# Patient Record
Sex: Female | Born: 1940 | Race: White | Hispanic: No
Health system: Southern US, Community
[De-identification: ages and names within clinical notes are randomized; demographics above are authoritative.]

## PROBLEM LIST (undated history)

## (undated) DIAGNOSIS — E039 Hypothyroidism, unspecified: Secondary | ICD-10-CM

## (undated) DIAGNOSIS — F0390 Unspecified dementia without behavioral disturbance: Secondary | ICD-10-CM

## (undated) DIAGNOSIS — I2510 Atherosclerotic heart disease of native coronary artery without angina pectoris: Secondary | ICD-10-CM

## (undated) DIAGNOSIS — E119 Type 2 diabetes mellitus without complications: Secondary | ICD-10-CM

## (undated) DIAGNOSIS — I739 Peripheral vascular disease, unspecified: Secondary | ICD-10-CM

## (undated) DIAGNOSIS — D649 Anemia, unspecified: Secondary | ICD-10-CM

---

## 2016-09-29 DIAGNOSIS — F39 Unspecified mood [affective] disorder: Secondary | ICD-10-CM | POA: Diagnosis not present

## 2016-09-29 DIAGNOSIS — I2510 Atherosclerotic heart disease of native coronary artery without angina pectoris: Secondary | ICD-10-CM

## 2016-09-29 DIAGNOSIS — I739 Peripheral vascular disease, unspecified: Secondary | ICD-10-CM

## 2016-09-29 DIAGNOSIS — K219 Gastro-esophageal reflux disease without esophagitis: Secondary | ICD-10-CM | POA: Diagnosis not present

## 2016-09-29 DIAGNOSIS — F0150 Vascular dementia without behavioral disturbance: Secondary | ICD-10-CM | POA: Diagnosis not present

## 2016-09-29 DIAGNOSIS — E441 Mild protein-calorie malnutrition: Secondary | ICD-10-CM | POA: Diagnosis not present

## 2016-09-29 DIAGNOSIS — E1159 Type 2 diabetes mellitus with other circulatory complications: Secondary | ICD-10-CM | POA: Diagnosis not present

## 2016-10-15 ENCOUNTER — Emergency Department: Payer: Medicare Other

## 2016-10-15 ENCOUNTER — Encounter: Payer: Self-pay | Admitting: *Deleted

## 2016-10-15 ENCOUNTER — Inpatient Hospital Stay
Admission: EM | Admit: 2016-10-15 | Discharge: 2016-11-14 | DRG: 064 | Disposition: E | Payer: Medicare Other | Attending: Internal Medicine | Admitting: Internal Medicine

## 2016-10-15 DIAGNOSIS — I615 Nontraumatic intracerebral hemorrhage, intraventricular: Principal | ICD-10-CM | POA: Diagnosis present

## 2016-10-15 DIAGNOSIS — R4020 Unspecified coma: Secondary | ICD-10-CM | POA: Diagnosis present

## 2016-10-15 DIAGNOSIS — R402213 Coma scale, best verbal response, none, at hospital admission: Secondary | ICD-10-CM | POA: Diagnosis present

## 2016-10-15 DIAGNOSIS — Z66 Do not resuscitate: Secondary | ICD-10-CM | POA: Diagnosis present

## 2016-10-15 DIAGNOSIS — R402143 Coma scale, eyes open, spontaneous, at hospital admission: Secondary | ICD-10-CM | POA: Diagnosis present

## 2016-10-15 DIAGNOSIS — I161 Hypertensive emergency: Secondary | ICD-10-CM | POA: Diagnosis present

## 2016-10-15 DIAGNOSIS — Z7984 Long term (current) use of oral hypoglycemic drugs: Secondary | ICD-10-CM | POA: Diagnosis not present

## 2016-10-15 DIAGNOSIS — Z515 Encounter for palliative care: Secondary | ICD-10-CM | POA: Diagnosis present

## 2016-10-15 DIAGNOSIS — I619 Nontraumatic intracerebral hemorrhage, unspecified: Secondary | ICD-10-CM

## 2016-10-15 DIAGNOSIS — G936 Cerebral edema: Secondary | ICD-10-CM | POA: Diagnosis present

## 2016-10-15 DIAGNOSIS — I2510 Atherosclerotic heart disease of native coronary artery without angina pectoris: Secondary | ICD-10-CM | POA: Diagnosis present

## 2016-10-15 DIAGNOSIS — R402343 Coma scale, best motor response, flexion withdrawal, at hospital admission: Secondary | ICD-10-CM | POA: Diagnosis present

## 2016-10-15 DIAGNOSIS — Z885 Allergy status to narcotic agent status: Secondary | ICD-10-CM

## 2016-10-15 DIAGNOSIS — Z79899 Other long term (current) drug therapy: Secondary | ICD-10-CM | POA: Diagnosis not present

## 2016-10-15 DIAGNOSIS — G935 Compression of brain: Secondary | ICD-10-CM

## 2016-10-15 DIAGNOSIS — E1151 Type 2 diabetes mellitus with diabetic peripheral angiopathy without gangrene: Secondary | ICD-10-CM | POA: Diagnosis present

## 2016-10-15 DIAGNOSIS — G9340 Encephalopathy, unspecified: Secondary | ICD-10-CM

## 2016-10-15 DIAGNOSIS — D638 Anemia in other chronic diseases classified elsewhere: Secondary | ICD-10-CM | POA: Diagnosis present

## 2016-10-15 DIAGNOSIS — F0150 Vascular dementia without behavioral disturbance: Secondary | ICD-10-CM | POA: Diagnosis present

## 2016-10-15 DIAGNOSIS — Z7902 Long term (current) use of antithrombotics/antiplatelets: Secondary | ICD-10-CM | POA: Diagnosis not present

## 2016-10-15 DIAGNOSIS — I629 Nontraumatic intracranial hemorrhage, unspecified: Secondary | ICD-10-CM

## 2016-10-15 DIAGNOSIS — E039 Hypothyroidism, unspecified: Secondary | ICD-10-CM | POA: Diagnosis present

## 2016-10-15 DIAGNOSIS — R4182 Altered mental status, unspecified: Secondary | ICD-10-CM | POA: Diagnosis present

## 2016-10-15 HISTORY — DX: Peripheral vascular disease, unspecified: I73.9

## 2016-10-15 HISTORY — DX: Type 2 diabetes mellitus without complications: E11.9

## 2016-10-15 HISTORY — DX: Hypothyroidism, unspecified: E03.9

## 2016-10-15 HISTORY — DX: Atherosclerotic heart disease of native coronary artery without angina pectoris: I25.10

## 2016-10-15 HISTORY — DX: Anemia, unspecified: D64.9

## 2016-10-15 HISTORY — DX: Unspecified dementia, unspecified severity, without behavioral disturbance, psychotic disturbance, mood disturbance, and anxiety: F03.90

## 2016-10-15 LAB — COMPREHENSIVE METABOLIC PANEL
ALBUMIN: 4.2 g/dL (ref 3.5–5.0)
ALT: 41 U/L (ref 14–54)
AST: 61 U/L — AB (ref 15–41)
Alkaline Phosphatase: 78 U/L (ref 38–126)
Anion gap: 14 (ref 5–15)
BUN: 26 mg/dL — AB (ref 6–20)
CHLORIDE: 99 mmol/L — AB (ref 101–111)
CO2: 24 mmol/L (ref 22–32)
CREATININE: 0.78 mg/dL (ref 0.44–1.00)
Calcium: 9.6 mg/dL (ref 8.9–10.3)
GFR calc non Af Amer: >60 mL/min (ref >60)
GLUCOSE: 268 mg/dL — AB (ref 65–99)
Potassium: 3.9 mmol/L (ref 3.5–5.1)
SODIUM: 137 mmol/L (ref 135–145)
Total Bilirubin: 0.7 mg/dL (ref 0.3–1.2)
Total Protein: 8.2 g/dL — ABNORMAL HIGH (ref 6.5–8.1)

## 2016-10-15 LAB — LACTIC ACID, PLASMA: LACTIC ACID, VENOUS: 2.5 mmol/L — AB (ref 0.5–1.9)

## 2016-10-15 LAB — CBC
HCT: 34.2 % — ABNORMAL LOW (ref 35.0–47.0)
Hemoglobin: 11.7 g/dL — ABNORMAL LOW (ref 12.0–16.0)
MCH: 30.4 pg (ref 26.0–34.0)
MCHC: 34.2 g/dL (ref 32.0–36.0)
MCV: 88.9 fL (ref 80.0–100.0)
Platelets: 207 10*3/uL (ref 150–440)
RBC: 3.84 MIL/uL (ref 3.80–5.20)
RDW: 13.6 % (ref 11.5–14.5)
WBC: 19.3 10*3/uL — ABNORMAL HIGH (ref 3.6–11.0)

## 2016-10-15 LAB — TROPONIN I

## 2016-10-15 LAB — GLUCOSE, CAPILLARY: Glucose-Capillary: 255 mg/dL — ABNORMAL HIGH (ref 65–99)

## 2016-10-15 MED ORDER — ORAL CARE MOUTH RINSE
15.0000 mL | Freq: Two times a day (BID) | OROMUCOSAL | Status: DC
  Administered 2016-10-15 – 2016-10-17 (×4): 15 mL via OROMUCOSAL

## 2016-10-15 MED ORDER — NICARDIPINE HCL IN NACL 20-0.86 MG/200ML-% IV SOLN
3.0000 mg/h | Freq: Once | INTRAVENOUS | Status: AC
  Administered 2016-10-15: 5 mg/h via INTRAVENOUS
  Filled 2016-10-15: qty 200

## 2016-10-15 MED ORDER — LORAZEPAM 2 MG/ML IJ SOLN
0.5000 mg | INTRAMUSCULAR | Status: DC | PRN
  Administered 2016-10-15: 20:00:00 0.5 mg via INTRAVENOUS
  Filled 2016-10-15: qty 1

## 2016-10-15 MED ORDER — MANNITOL 25 % IV SOLN
25.0000 g | Freq: Once | INTRAVENOUS | Status: AC
  Administered 2016-10-15: 25 g via INTRAVENOUS
  Filled 2016-10-15 (×2): qty 100

## 2016-10-15 MED ORDER — MORPHINE SULFATE (CONCENTRATE) 10 MG/0.5ML PO SOLN
10.0000 mg | ORAL | Status: DC | PRN
  Administered 2016-10-15: 19:00:00 10 mg via ORAL
  Filled 2016-10-15: qty 1

## 2016-10-15 MED ORDER — SODIUM CHLORIDE 0.9 % IV BOLUS (SEPSIS)
1000.0000 mL | Freq: Once | INTRAVENOUS | Status: AC
  Administered 2016-10-15: 1000 mL via INTRAVENOUS

## 2016-10-15 NOTE — Progress Notes (Signed)
LCSW consulted with ED RN and she was calling the chaplain for the family. LCSW met with family and consulted medical staff and found out the family does have to agree to pay the bed at [facility]. At this time the family has been advised that Seth Bake at [facility] was called and that no decisions had to be made right now.   LCSW will provide support to family as needed.  [employer] LCSW [phone]

## 2016-10-15 NOTE — ED Notes (Signed)
Dr. Roxan Hockeyobinson notified of critical lactic acid

## 2016-10-15 NOTE — ED Notes (Signed)
Pt resting in bed, per admitting MD cardene and mannitol turned off, POA granddaughter at bedside to make deicisions

## 2016-10-15 NOTE — ED Notes (Signed)
Admitting MD at bedside.

## 2016-10-15 NOTE — Plan of Care (Signed)
Problem: Education: Goal: Knowledge of [facility] Education information/materials will improve Outcome: Progressing Pt new admission to Room 105 under Comfort Measures this shift

## 2016-10-15 NOTE — H&P (Signed)
[facility] [place] [facility]   PATIENT NAME: Linda Austin    MR#:  [id number]  DATE OF BIRTH:  1940/05/30  DATE OF ADMISSION:  11/06/2016  PRIMARY CARE PHYSICIAN: Karie Schwalbe, MD   REQUESTING/REFERRING PHYSICIAN: Dr. Willy Eddy  CHIEF COMPLAINT: Altered mental status    Chief Complaint  Patient presents with  . Altered Mental Status    HISTORY OF PRESENT ILLNESS:  Linda Austin  is a 76 y.o. female with a known history of coronary artery disease on Plavix comes from [facility] [place] because of worsening level of consciousness since morning associated with multiple episodes of vomiting. Patient noted to have large acute intracranial hemorrhage in left hemisphere with cerebral edema, mass effect. Reportedly patient was normal yesterday. And patient family mainly the daughter and granddaughter who is the power of attorney requested comfort care measures.  PAST MEDICAL HISTORY:   Past Medical History:  Diagnosis Date  . Anemia   . Coronary artery disease   . Dementia   . Diabetes mellitus without complication (HCC)   . Hypothyroid   . PVD (peripheral vascular disease) (HCC)     PAST SURGICAL HISTOIRY:  History reviewed. No pertinent surgical history.  SOCIAL HISTORY:   Social History  Substance Use Topics  . Smoking status: Unknown If Ever Smoked  . Smokeless tobacco: Not on file  . Alcohol use No    FAMILY HISTORY:  No family history on file.  DRUG ALLERGIES:   Allergies  Allergen Reactions  . Codeine     REVIEW OF SYSTEMS:  Unable to obtain review of systems because of her mental status.  MEDICATIONS AT HOME:   Prior to Admission medications   Medication Sig Start Date End Date Taking? Authorizing Provider  acetaminophen (TYLENOL) 325 MG tablet Take 650 mg by mouth 3 (three) times daily as needed for pain.   Yes [provider]  atorvastatin (LIPITOR) 40 MG tablet Take 40 mg by mouth daily. 10/06/16  Yes [provider]  carvedilol (COREG) 6.25 MG tablet Take 6.25 mg by mouth 2 (two) times daily. 09/27/16  Yes [provider]  clopidogrel (PLAVIX) 75 MG tablet Take 75 mg by mouth daily. 09/27/16  Yes [provider]  donepezil (ARICEPT) 10 MG tablet Take 10 mg by mouth at bedtime. 09/27/16  Yes [provider]  ergocalciferol (VITAMIN D2) 50000 units capsule Take 50,000 Units by mouth every 30 (thirty) days.   Yes [provider]  fexofenadine (ALLEGRA) 60 MG tablet Take 60 mg by mouth 2 (two) times daily as needed.   Yes [provider]  levothyroxine (SYNTHROID, LEVOTHROID) 50 MCG tablet Take 50 mcg by mouth daily. 09/27/16  Yes [provider]  LORazepam (ATIVAN) 0.5 MG tablet Take 0.25 mg by mouth every 8 (eight) hours as needed for anxiety. 09/27/16 10/30/16 Yes [provider]  memantine (NAMENDA) 10 MG tablet Take 10 mg by mouth 2 (two) times daily. 10/12/16  Yes [provider]  metFORMIN (GLUCOPHAGE) 500 MG tablet Take 500 mg by mouth 2 (two) times daily. 09/27/16  Yes [provider]  Multiple Vitamin (MULTI-VITAMINS) TABS Take 1 tablet by mouth daily.   Yes [provider]  pantoprazole (PROTONIX) 40 MG tablet Take 40 mg by mouth daily. 09/27/16  Yes [provider]  traZODone (DESYREL) 50 MG tablet Take 50 mg by mouth at bedtime. 10/13/16  Yes [provider]      VITAL SIGNS:  Blood  pressure (!) 188/73, pulse 92, temperature 97.6 F (36.4 C), temperature source Oral, resp. rate 20, height 5\' 2"  (1.575 m), weight 45.4 kg (100 lb), SpO2 98 %.  PHYSICAL EXAMINATION:  GENERAL:  76 y.o.-year-old patient lying in the bed She is comatose.  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  HEENT: Head atraumatic, normocephalic.  Oropharynx and nasopharynx clear.  NECK:  Supple,  no jugular venous distention. No thyroid enlargement, no tenderness.  LUNGS: Normal breath sounds bilaterally, no wheezing, rales,rhonchi or crepitation. No use of accessory muscles of respiration.  CARDIOVASCULAR: S1, S2 normal. No murmurs, rubs, or gallops.  ABDOMEN: Soft,, nondistended. Bowel sounds present. No organomegaly or mass.  EXTREMITIES: No pedal edema, cyanosis, or clubbing.  NEUROLOGIC: Not following commands due to, coma PSYCHIATRIC: The patient is alert and oriented x 3.  SKIN: No obvious rash, lesion, or ulcer.   LABORATORY PANEL:   CBC  Recent Labs Lab November 07, 2016 1509  WBC 19.3*  HGB 11.7*  HCT 34.2*  PLT 207   ------------------------------------------------------------------------------------------------------------------  Chemistries   Recent Labs Lab 11/07/16 1509  NA 137  K 3.9  CL 99*  CO2 24  GLUCOSE 268*  BUN 26*  CREATININE 0.78  CALCIUM 9.6  AST 61*  ALT 41  ALKPHOS 78  BILITOT 0.7   ------------------------------------------------------------------------------------------------------------------  Cardiac Enzymes  Recent Labs Lab 2016/11/07 1509  TROPONINI <0.03   ------------------------------------------------------------------------------------------------------------------  RADIOLOGY:  Ct Abdomen Pelvis Wo Contrast  Result Date: Nov 07, 2016 CLINICAL DATA:  76 year old female with abdominal and pelvic pain. EXAM: CT ABDOMEN AND PELVIS WITHOUT CONTRAST TECHNIQUE: Multidetector CT imaging of the abdomen and pelvis was performed following the standard protocol without IV contrast. COMPARISON:  None. FINDINGS: Please note that parenchymal abnormalities may be missed without intravenous contrast. Lower chest: No acute abnormality Hepatobiliary: The liver is unremarkable. The patient is status post cholecystectomy. No biliary dilatation. Pancreas: Unremarkable Spleen: Unremarkable Adrenals/Urinary Tract: No acute abnormalities of the kidneys.  The adrenal glands are unremarkable. Bladder distention noted. Stomach/Bowel: There is no evidence of bowel obstruction, definite bowel wall thickening or inflammatory changes. Colonic diverticulosis noted without evidence of diverticulitis. Vascular/Lymphatic: Heavy atherosclerotic aortic and other vascular calcifications are noted. Ectatic distal abdominal aorta noted measuring 2.8 cm in greatest diameter. 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 No enlarged lymph nodes are identified. Reproductive: Status post hysterectomy. No adnexal masses. Other: No ascites, focal collection or pneumoperitoneum. Musculoskeletal: No acute abnormality or suspicious bony lesion. IMPRESSION: 1. No evidence of acute abnormality. 2. 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 Consider six-month cross-sectional imaging follow-up as clinically indicated. 3. Ectatic abdominal aorta. Ectatic abdominal aorta at risk for aneurysm development. Recommend followup by ultrasound in 5 years. This recommendation follows ACR consensus guidelines: White Paper of the ACR Incidental Findings Committee II on Vascular Findings. J Am Coll Radiol 2013; 91:478-295. 4.  Aortic Atherosclerosis (ICD10-I70.0). Electronically Signed   By: Harmon Pier M.D.   On: 2016/11/07 16:15   Ct Head Wo Contrast  Result Date: Nov 07, 2016 CLINICAL DATA:  76 year old female with acute onset altered mental status and loss of language. EXAM: CT HEAD WITHOUT CONTRAST CT CERVICAL SPINE WITHOUT CONTRAST TECHNIQUE: Multidetector CT imaging of the head and cervical spine was performed following the standard protocol without intravenous contrast. Multiplanar CT image reconstructions of the cervical spine were also generated. COMPARISON:  None. FINDINGS:  CT HEAD FINDINGS Brain: Large hyperdense hemorrhage in the posterior left hemisphere. Epicenter appears to be the left occipital lobe, but hemorrhage tracks into the posterior left temporal  lobe. The intra-axial volume of hemorrhage is estimated at 85 mL- 73 x 53 x 44 mm (AP by transverse by CC). Surrounding cerebral edema. There is extension of hemorrhage into the left subdural space (mostly along the posterior falx and the tentorium) and into the ventricular system, with a moderate to large volume of blood in both lateral ventricles, greater on the left, and a small volume of third ventricle blood. There is also trace subarachnoid space blood also over the right superior convexity and posterior sylvian fissure. Intracranial mass effect with 9 mm of rightward midline shift. Effaced sulci in the left hemisphere. Partially effaced left ambient cistern. Other basilar cisterns are patent. Mild to moderate ventriculomegaly. Superimposed confluent bilateral cerebral white matter hypodensity. Vascular: Calcified atherosclerosis at the skull base. Skull: Osteopenia.  No skull fracture. Sinuses/Orbits: Clear. Other: No acute orbit or scalp soft tissue finding. CT CERVICAL SPINE FINDINGS Alignment: Straightening and mild reversal of cervical lordosis. Mild anterolisthesis of C4 on C5 and C7 on T1. Mild retrolisthesis of C5 on C6. Bilateral posterior element alignment is within normal limits. Skull base and vertebrae: Visualized skull base is intact. No atlanto-occipital dissociation. No cervical spine fracture identified. Soft tissues and spinal canal: No prevertebral fluid or swelling. No visible canal hematoma. Surgical clips along the left sternocleidomastoid muscle. Calcified carotid atherosclerosis. Partially visible proximal left subclavian artery stent. Disc levels: Widespread left side lumbar facet degeneration. Moderate to severe disc space loss at C5-C6 and C6-C7 with some endplate spurring. No significant cervical spinal stenosis. Upper chest: Visible upper thoracic levels appear intact. Negative lung apices. IMPRESSION: 1. Large acute hemorrhage in the posterior left hemisphere centered in the left  occipital lobe with estimated intra-axial blood volume 85 mL. Top differential considerations include hypertensive hemorrhage, AVM, or coagulopathy. 2. Extension into the posterior left subdural space and the ventricular system. Trace subarachnoid extension. Small volume left subdural hematoma at this time, and moderate to large volume of IVH. 3. Left hemisphere cerebral edema and intracranial mass effect with 9 mm of rightward midline shift. Mild to moderate ventriculomegaly. 4.  No acute fracture or listhesis identified in the cervical spine. 5. Critical Value/emergent results were called by telephone at the time of interpretation on 10/19/2016 at 4:16 pm to Dr. Willy EddyPATRICK ROBINSON , who verbally acknowledged these results. Electronically Signed   By: Odessa FlemingH  Hall M.D.   On: 11/10/2016 16:19   Ct Cervical Spine Wo Contrast  Result Date: 10/26/2016 CLINICAL DATA:  76 year old female with acute onset altered mental status and loss of language. EXAM: CT HEAD WITHOUT CONTRAST CT CERVICAL SPINE WITHOUT CONTRAST TECHNIQUE: Multidetector CT imaging of the head and cervical spine was performed following the standard protocol without intravenous contrast. Multiplanar CT image reconstructions of the cervical spine were also generated. COMPARISON:  None. FINDINGS: CT HEAD FINDINGS Brain: Large hyperdense hemorrhage in the posterior left hemisphere. Epicenter appears to be the left occipital lobe, but hemorrhage tracks into the posterior left temporal lobe. The intra-axial volume of hemorrhage is estimated at 85 mL- 73 x 53 x 44 mm (AP by transverse by CC). Surrounding cerebral edema. There is extension of hemorrhage into the left subdural space (mostly along the posterior falx and the tentorium) and into the ventricular system, with a moderate to large volume of blood in both lateral ventricles, greater on the left,  and a small volume of third ventricle blood. There is also trace subarachnoid space blood also over the right  superior convexity and posterior sylvian fissure. Intracranial mass effect with 9 mm of rightward midline shift. Effaced sulci in the left hemisphere. Partially effaced left ambient cistern. Other basilar cisterns are patent. Mild to moderate ventriculomegaly. Superimposed confluent bilateral cerebral white matter hypodensity. Vascular: Calcified atherosclerosis at the skull base. Skull: Osteopenia.  No skull fracture. Sinuses/Orbits: Clear. Other: No acute orbit or scalp soft tissue finding. CT CERVICAL SPINE FINDINGS Alignment: Straightening and mild reversal of cervical lordosis. Mild anterolisthesis of C4 on C5 and C7 on T1. Mild retrolisthesis of C5 on C6. Bilateral posterior element alignment is within normal limits. Skull base and vertebrae: Visualized skull base is intact. No atlanto-occipital dissociation. No cervical spine fracture identified. Soft tissues and spinal canal: No prevertebral fluid or swelling. No visible canal hematoma. Surgical clips along the left sternocleidomastoid muscle. Calcified carotid atherosclerosis. Partially visible proximal left subclavian artery stent. Disc levels: Widespread left side lumbar facet degeneration. Moderate to severe disc space loss at C5-C6 and C6-C7 with some endplate spurring. No significant cervical spinal stenosis. Upper chest: Visible upper thoracic levels appear intact. Negative lung apices. IMPRESSION: 1. Large acute hemorrhage in the posterior left hemisphere centered in the left occipital lobe with estimated intra-axial blood volume 85 mL. Top differential considerations include hypertensive hemorrhage, AVM, or coagulopathy. 2. Extension into the posterior left subdural space and the ventricular system. Trace subarachnoid extension. Small volume left subdural hematoma at this time, and moderate to large volume of IVH. 3. Left hemisphere cerebral edema and intracranial mass effect with 9 mm of rightward midline shift. Mild to moderate ventriculomegaly.  4.  No acute fracture or listhesis identified in the cervical spine. 5. Critical Value/emergent results were called by telephone at the time of interpretation on 11/04/2016 at 4:16 pm to Dr. Willy Eddy , who verbally acknowledged these results. Electronically Signed   By: Odessa Fleming M.D.   On: 11/13/2016 16:19   Dg Chest Portable 1 View  Result Date: 10/25/2016 CLINICAL DATA:  Altered mental status this morning. EXAM: PORTABLE CHEST 1 VIEW COMPARISON:  None. FINDINGS: The heart size and mediastinal contours are within normal limits. Biapical scarring are noted. There is no focal infiltrate, pulmonary edema or pleural effusion. The visualized skeletal structures are unremarkable. IMPRESSION: No active cardiopulmonary disease. Electronically Signed   By: Sherian Rein M.D.   On: 11/09/2016 15:45    EKG:   Orders placed or performed during the hospital encounter of 10/31/2016  . EKG 12-Lead  . EKG 12-Lead  . ED EKG  . ED EKG    IMPRESSION AND PLAN:   76 year old female patient with history of coronary artery disease on Plavix was reportedly normal state yesterday found to have multiple episodes of vomiting, worsening level of consciousness found to have large intracranial hemorrhage in the left occipital lobe with extension to his left subdural space and cerebral edema with mass effect and right word midline shift. Patient condition discussed with granddaughter with the power of attorney, she wants comfort measures only. Says she is on morphine, Ativan and she is admitted to 1C. #2 hypertensive emergency:received nicardipine drip and mannitol intially.   All the records are reviewed and case discussed with ED provider. Management plans discussed with the patient, family and they are in agreement.  CODE STATUS: DNR  TOTAL TIME TAKING CARE OF THIS PATIENT: 55 minutes.    Katha Hamming M.D on 10/29/2016  at 7:19 PM  Between 7am to 6pm - Pager - [phone]  After 6pm go to [email or web address]  - password [email or web address]  [facility]  Office  [phone]  CC: Primary care physician; Karie Schwalbe, MD  Note: This dictation was prepared with Dragon dictation along with smaller phrase technology. Any transcriptional errors that result from this process are unintentional.

## 2016-10-15 NOTE — ED Provider Notes (Signed)
[facility] Emergency Department Provider Note    First MD Initiated Contact with Patient 10/20/2016 1500     (approximate)  I have reviewed the triage vital signs and the nursing notes.   HISTORY  Chief Complaint Altered Mental Status    HPI Alie Moudy is a 76 y.o. female presents from nursing facility with altered mental status nausea vomiting and abdominal pain. Last seen normal was last night. Does have a history of vascular dementia, diabetes and peripheral vascular disease and CAD. Patient unable to provide additional history at this time. She is accompanied by her granddaughter.  Her soon-to-be having some abdominal discomfort. No reported fevers.   Past Medical History:  Diagnosis Date  . Anemia   . Coronary artery disease   . Dementia   . Diabetes mellitus without complication (HCC)   . Hypothyroid   . PVD (peripheral vascular disease) (HCC)    No family history on file. History reviewed. No pertinent surgical history. There are no active problems to display for this patient.     Prior to Admission medications   Not on File    Allergies Codeine    Social History Social History  Substance Use Topics  . Smoking status: Unknown If Ever Smoked  . Smokeless tobacco: Not on file  . Alcohol use No    Review of Systems Patient denies headaches, rhinorrhea, blurry vision, numbness, shortness of breath, chest pain, edema, cough, abdominal pain, nausea, vomiting, diarrhea, dysuria, fevers, rashes or hallucinations unless otherwise stated above in HPI. ____________________________________________   PHYSICAL EXAM:  VITAL SIGNS: Vitals:   10/25/2016 1630 10/24/2016 1645  BP: (!) 180/62 (!) 164/53  Pulse: 89 85  Resp: (!) 24 (!) 27  Temp:    SpO2: 100% 99%    Constitutional: very ill and cachectic appearing, protecting her airway Eyes: Conjunctivae are normal.  Head: Atraumatic. Nose: No congestion/rhinnorhea. Mouth/Throat:  Mucous membranes are dry Neck: No stridor. Painless ROM.   Bilateral CAE scars Cardiovascular: Normal rate, regular rhythm. Grossly normal heart sounds.  Good peripheral circulation. Respiratory: Normal respiratory effort.  No retractions. Lungs CTAB. Gastrointestinal: Soft but with diffuse ttp. No distention. No abdominal bruits. No CVA tenderness. Genitourinary:  Musculoskeletal: No lower extremity tenderness nor edema.  No joint effusions. Neurologic:  No facial droop, non cooperative with exam 2/2 encephalopathy Skin:  Skin is warm, dry and intact. No rash noted. Psychiatric: uable to assess  ____________________________________________   LABS (all labs ordered are listed, but only abnormal results are displayed)  Results for orders placed or performed during the hospital encounter of 11/10/2016 (from the past 24 hour(s))  Comprehensive metabolic panel     Status: Abnormal   Collection Time: 10/27/2016  3:09 PM  Result Value Ref Range   Sodium 137 135 - 145 mmol/L   Potassium 3.9 3.5 - 5.1 mmol/L   Chloride 99 (L) 101 - 111 mmol/L   CO2 24 22 - 32 mmol/L   Glucose, Bld 268 (H) 65 - 99 mg/dL   BUN 26 (H) 6 - 20 mg/dL   Creatinine, Ser 1.61 0.44 - 1.00 mg/dL   Calcium 9.6 8.9 - 09.6 mg/dL   Total Protein 8.2 (H) 6.5 - 8.1 g/dL   Albumin 4.2 3.5 - 5.0 g/dL   AST 61 (H) 15 - 41 U/L   ALT 41 14 - 54 U/L   Alkaline Phosphatase 78 38 - 126 U/L   Total Bilirubin 0.7 0.3 - 1.2 mg/dL   GFR calc  non Af Amer >60 >60 mL/min   GFR calc Af Amer >60 >60 mL/min   Anion gap 14 5 - 15  CBC     Status: Abnormal   Collection Time: 2016-08-26  3:09 PM  Result Value Ref Range   WBC 19.3 (H) 3.6 - 11.0 K/uL   RBC 3.84 3.80 - 5.20 MIL/uL   Hemoglobin 11.7 (L) 12.0 - 16.0 g/dL   HCT 95.234.2 (L) 84.135.0 - 32.447.0 %   MCV 88.9 80.0 - 100.0 fL   MCH 30.4 26.0 - 34.0 pg   MCHC 34.2 32.0 - 36.0 g/dL   RDW 40.113.6 02.711.5 - 25.314.5 %   Platelets 207 150 - 440 K/uL  Troponin I     Status: None   Collection Time:  2016-08-26  3:09 PM  Result Value Ref Range   Troponin I <0.03 <0.03 ng/mL  Glucose, capillary     Status: Abnormal   Collection Time: 2016-08-26  3:16 PM  Result Value Ref Range   Glucose-Capillary 255 (H) 65 - 99 mg/dL  Lactic acid, plasma     Status: Abnormal   Collection Time: 2016-08-26  3:20 PM  Result Value Ref Range   Lactic Acid, Venous 2.5 (HH) 0.5 - 1.9 mmol/L   ____________________________________________  EKG My review and personal interpretation at Time: 15:02   Indication: ams  Rate: 80  Rhythm: sinus Axis: normal Other: borderline prolonged qt, no stemi,  ____________________________________________  RADIOLOGY  I personally reviewed all radiographic images ordered to evaluate for the above acute complaints and reviewed radiology reports and findings.  These findings were personally discussed with the patient.  Please see medical record for radiology report.  ____________________________________________   PROCEDURES  Procedure(s) performed:  Procedures    Critical Care performed: yes CRITICAL CARE Performed by: Willy EddyPatrick Ashiyah Pavlak   Total critical care time: 40 minutes  Critical care time was exclusive of separately billable procedures and treating other patients.  Critical care was necessary to treat or prevent imminent or life-threatening deterioration.  Critical care was time spent personally by me on the following activities: development of treatment plan with patient and/or surrogate as well as nursing, discussions with consultants, evaluation of patient's response to treatment, examination of patient, obtaining history from patient or surrogate, ordering and performing treatments and interventions, ordering and review of laboratory studies, ordering and review of radiographic studies, pulse oximetry and re-evaluation of patient's condition.  ____________________________________________   INITIAL IMPRESSION / ASSESSMENT AND PLAN / ED COURSE  Pertinent  labs & imaging results that were available during my care of the patient were reviewed by me and considered in my medical decision making (see chart for details).  DDX: Dehydration, sepsis, pna, uti, hypoglycemia, cva, drug effect, withdrawal, encephalitis   Dawayne PatriciaBetty Wehrli is a 76 y.o. who presents to the ED with altered mental status.  Patient is critically ill-appearing but is reportedly DO NOT RESUSCITATE. Currently protecting her airway. Blood work sent for the above differential. EKG shows no evidence of acute ischemia. Glucose is ok.  Patient taken emergently to CT scanner due to concern for above differential.  Clinical Course as of Oct 16 1646  Sat Oct 15, 2016  1602 Patient with evidence of large intraparenchymal hemorrhage with midline shift and herniation.  [PR]  1626 Discussed case with radiology and the granddaughter at bedside. Unfortunately prognosis is exceedingly poor. The patient is DO NOT RESUSCITATE and after further discussions with the family she would not want to be intubated or have any invasive  procedures performed and only wants to be kept comfortable. In the setting of this significant bleeding do feel that that is a reasonable option as I dictate worry about pretty significant disability even if she were to survive this event.  Patient was placed on Cardizem drip just for blood pressure control will continue for the time being. She did receive a dose of IV mannitol. At this point I do feel the patient progressed service admission for comfort care.  [PR]    Clinical Course User Index [PR] Willy Eddy, MD     ____________________________________________   FINAL CLINICAL IMPRESSION(S) / ED DIAGNOSES  Final diagnoses:  Hemorrhagic stroke (HCC)  Herniation of the brain [facility])  Acute encephalopathy      NEW MEDICATIONS STARTED DURING THIS VISIT:  New Prescriptions   No medications on file     Note:  This document was prepared using Dragon voice recognition  software and may include unintentional dictation errors.    Willy Eddy, MD 11/06/2016 [phone]

## 2016-10-15 NOTE — ED Notes (Signed)
EDP at bedside  

## 2016-10-15 NOTE — ED Notes (Signed)
Patient transported to CT 

## 2016-10-15 NOTE — ED Triage Notes (Signed)
[facility], per staff at 9 am this AM they noticed pt to be alterted, states pt is normally talkative but will not follow commands or talk this AM, upon arrival to ER pt is pale, non-verbal just moaning, cool and clammy skin, pt will move eyes to her name but no verbal response

## 2016-10-16 MED ORDER — ONDANSETRON HCL 4 MG/2ML IJ SOLN
4.0000 mg | Freq: Four times a day (QID) | INTRAMUSCULAR | Status: DC | PRN
  Administered 2016-10-17 – 2016-10-18 (×3): 4 mg via INTRAVENOUS
  Filled 2016-10-16 (×3): qty 2

## 2016-10-16 MED ORDER — MORPHINE SULFATE (CONCENTRATE) 10 MG/0.5ML PO SOLN
5.0000 mg | ORAL | Status: DC | PRN
  Administered 2016-10-16 – 2016-10-17 (×2): 5 mg via ORAL
  Filled 2016-10-16 (×2): qty 1

## 2016-10-16 NOTE — Progress Notes (Signed)
Family Meeting Note  Advance Directive:yes  Today a meeting took place with the Patient and Daughters at bedside.  Patient is unable to participate due ZO:[id number]:Lacked capacity Comatose   The following clinical team members were present during this meeting:MD  The following were discussed:Patient's diagnosis: , Patient's progosis: < 2 weeks and Goals for treatment: DNR   76 year old female patient with history of coronary artery disease on Plavix was reportedly normal state 1 day before admission found to have multiple episodes of vomiting, worsening level of consciousness found to have large intracranial hemorrhage in the left occipital lobe on CT head in ED with extension to his left subdural space and cerebral edema with mass effect and right word midline shift.   *Acute intracranial hemorrhage - Patient condition discussed with granddaughter with the power of attorney, she wants comfort measures only. Says she is on morphine, Ativan.  * Hypertensive emergency   Additional follow-up to be provided: Social worker -if patient remains alive for next 24-48 hours she may need placement into hospice home.  Time spent during discussion:20 minutes  Delfino LovettVipul Mishal Probert, MD

## 2016-10-16 NOTE — Plan of Care (Signed)
Problem: Pain Management: Goal: General experience of comfort will improve Outcome: Progressing PRN Roxanol dose reduced by MD this shift; gave dose x 1 with no s/e of n/v and relief of pain

## 2016-10-16 NOTE — Clinical Social Work Note (Signed)
Clinical Social Work Assessment  Patient Details  Name: Linda Austin MRN: [id number] Date of Birth: Apr 13, 1940  Date of referral:  11/10/2016               Reason for consult:  Family Concerns, End of Life/Hospice                Permission sought to share information with:  Family Supports Permission granted to share information::  Yes, Verbal Permission Granted  Name::     Linda Austin [phone]  Linda Austin Daughter Linda Austin [phone] son  Agency::  [facility]  Relationship::     Contact Information:     Housing/Transportation Living arrangements for the past 2 months:  [place] of Information:  Power of Linda Austin Patient Interpreter Needed:  None Criminal Activity/Legal Involvement Pertinent to Current Situation/Hospitalization:  No - Comment as needed Significant Relationships:  Adult Children Lives with:  Facility Resident Do you feel safe going back to the place where you live?  Yes Need for family participation in patient care:  Yes (Comment)  Care giving concerns: Family has requested Chaplain support and comfort care measures   Social Worker assessment / plan: LCSW met with patient , daughter, other family relatives yesterday and patients grand daughter Linda Austin. LCSW was unable to assess patient as she was un responsive at times and has vascular dementia. HCPOA was able to provide information to complete assessment. Patient was brought to hospital from [facility] and she became unresponsive. [name] informed this worker she has inoperable brain bleed at this time and wishes comfort care measures only. Family explained to [facility] representative not to hold her bed and family prepares for pts possible end of life. Family members are very supportive and currently there is no further needs at this time.   Employment status:  Retired Forensic scientist:  Information systems manager, Medicaid In [employer], Other ([employer]) ([employer]) PT Recommendations:    Information / Referral to community resources:     Patient/Family's Response to care: HCPOA is very well informed of pts condition and family members are supportive  Patient/Family's Understanding of and Emotional Response to Diagnosis, Current Treatment, and Prognosis: Good understanding by family.  Emotional Assessment Appearance:  Appears stated age Attitude/Demeanor/Rapport:  Unable to Assess Affect (typically observed):  Unable to Assess Orientation:   (not oriented) Alcohol / Substance use:  Not Applicable Psych involvement (Current and /or in the community):  No (Comment)  Discharge Needs  Concerns to be addressed:  Care Coordination Readmission within the last 30 days:  No Current discharge risk:  Terminally ill Barriers to Discharge:  Continued Medical Work up   [place], LCSW 10/16/2016, 8:15 AM

## 2016-10-16 NOTE — Progress Notes (Signed)
LCSW went to patients room and provided comfort support. Patients grand daughter was resting and patient other daughter has arrived from [place]. Family reported they are well cared for and have no needs at this time. LCSW explained I would be available all day today if required.  Frederich Montilla LCSW

## 2016-10-16 NOTE — Progress Notes (Signed)
1        [facility] [place] [facility]   PATIENT NAME: Linda Austin    MR#:  [id number]  DATE OF BIRTH:  Dec 09, 1940  SUBJECTIVE:  CHIEF COMPLAINT:   Chief Complaint  Patient presents with  . Altered Mental Status  Comatose REVIEW OF SYSTEMS:  Review of Systems  Unable to perform ROS: Critical illness    DRUG ALLERGIES:   Allergies  Allergen Reactions  . Codeine    VITALS:  Blood pressure (!) 206/99, pulse 96, temperature 97.6 F (36.4 C), temperature source Oral, resp. rate (!) 22, height 5\' 2"  (1.575 m), weight 45.4 kg (100 lb), SpO2 99 %. PHYSICAL EXAMINATION:  Physical Exam  Constitutional: She appears lethargic, malnourished and dehydrated. She appears unhealthy. She appears cachectic. She appears toxic. She has a sickly appearance.  HENT:  Head: Normocephalic and atraumatic.  Eyes: Pupils are equal, round, and reactive to light. Conjunctivae and EOM are normal.  Neck: Normal range of motion. Neck supple. No tracheal deviation present. No thyromegaly present.  Cardiovascular: Normal rate, regular rhythm and normal heart sounds.   Pulmonary/Chest: Effort normal and breath sounds normal. No respiratory distress. She has no wheezes. She exhibits no tenderness.  Abdominal: Soft. Bowel sounds are normal. She exhibits no distension. There is no tenderness.  Musculoskeletal: Normal range of motion.  Neurological: She appears lethargic. She is disoriented. No cranial nerve deficit.  Comatose  Skin: Skin is warm and dry. No rash noted.  Psychiatric:  Unable to assess due to comatose status   LABORATORY PANEL:  Female CBC  Recent Labs Lab 11/02/16 1509  WBC 19.3*  HGB 11.7*  HCT 34.2*  PLT 207   ------------------------------------------------------------------------------------------------------------------ Chemistries   Recent Labs Lab 02-Nov-2016 1509  NA 137  K 3.9  CL 99*  CO2 24  GLUCOSE 268*  BUN 26*  CREATININE 0.78  CALCIUM 9.6    AST 61*  ALT 41  ALKPHOS 78  BILITOT 0.7   RADIOLOGY:  Ct Abdomen Pelvis Wo Contrast  Result Date: 02-Nov-2016 CLINICAL DATA:  76 year old female with abdominal and pelvic pain. EXAM: CT ABDOMEN AND PELVIS WITHOUT CONTRAST TECHNIQUE: Multidetector CT imaging of the abdomen and pelvis was performed following the standard protocol without IV contrast. COMPARISON:  None. FINDINGS: Please note that parenchymal abnormalities may be missed without intravenous contrast. Lower chest: No acute abnormality Hepatobiliary: The liver is unremarkable. The patient is status post cholecystectomy. No biliary dilatation. Pancreas: Unremarkable Spleen: Unremarkable Adrenals/Urinary Tract: No acute abnormalities of the kidneys. The adrenal glands are unremarkable. Bladder distention noted. Stomach/Bowel: There is no evidence of bowel obstruction, definite bowel wall thickening or inflammatory changes. Colonic diverticulosis noted without evidence of diverticulitis. Vascular/Lymphatic: Heavy atherosclerotic aortic and other vascular calcifications are noted. Ectatic distal abdominal aorta noted measuring 2.8 cm in greatest diameter. 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 No enlarged lymph nodes are identified. Reproductive: Status post hysterectomy. No adnexal masses. Other: No ascites, focal collection or pneumoperitoneum. Musculoskeletal: No acute abnormality or suspicious bony lesion. IMPRESSION: 1. No evidence of acute abnormality. 2. 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 Consider six-month cross-sectional imaging follow-up as clinically indicated. 3. Ectatic abdominal aorta. Ectatic abdominal aorta at risk for aneurysm development. Recommend followup by ultrasound in 5 years. This recommendation follows ACR consensus guidelines: White Paper of the ACR Incidental Findings Committee II on Vascular Findings. J Am Coll Radiol 2013; 40:981-191. 4.  Aortic Atherosclerosis (ICD10-I70.0).  Electronically Signed   By: Harmon PierJeffrey  Hu M.D.   On: 11/12/2016 16:15   Ct Head Wo Contrast  Result Date: 10/26/2016 CLINICAL DATA:  76 year old female with acute onset altered mental status and loss of language. EXAM: CT HEAD WITHOUT CONTRAST CT CERVICAL SPINE WITHOUT CONTRAST TECHNIQUE: Multidetector CT imaging of the head and cervical spine was performed following the standard protocol without intravenous contrast. Multiplanar CT image reconstructions of the cervical spine were also generated. COMPARISON:  None. FINDINGS: CT HEAD FINDINGS Brain: Large hyperdense hemorrhage in the posterior left hemisphere. Epicenter appears to be the left occipital lobe, but hemorrhage tracks into the posterior left temporal lobe. The intra-axial volume of hemorrhage is estimated at 85 mL- 73 x 53 x 44 mm (AP by transverse by CC). Surrounding cerebral edema. There is extension of hemorrhage into the left subdural space (mostly along the posterior falx and the tentorium) and into the ventricular system, with a moderate to large volume of blood in both lateral ventricles, greater on the left, and a small volume of third ventricle blood. There is also trace subarachnoid space blood also over the right superior convexity and posterior sylvian fissure. Intracranial mass effect with 9 mm of rightward midline shift. Effaced sulci in the left hemisphere. Partially effaced left ambient cistern. Other basilar cisterns are patent. Mild to moderate ventriculomegaly. Superimposed confluent bilateral cerebral white matter hypodensity. Vascular: Calcified atherosclerosis at the skull base. Skull: Osteopenia.  No skull fracture. Sinuses/Orbits: Clear. Other: No acute orbit or scalp soft tissue finding. CT CERVICAL SPINE FINDINGS Alignment: Straightening and mild reversal of cervical lordosis. Mild anterolisthesis of C4 on C5 and C7 on T1. Mild retrolisthesis of C5 on C6. Bilateral posterior element alignment is within normal limits. Skull base  and vertebrae: Visualized skull base is intact. No atlanto-occipital dissociation. No cervical spine fracture identified. Soft tissues and spinal canal: No prevertebral fluid or swelling. No visible canal hematoma. Surgical clips along the left sternocleidomastoid muscle. Calcified carotid atherosclerosis. Partially visible proximal left subclavian artery stent. Disc levels: Widespread left side lumbar facet degeneration. Moderate to severe disc space loss at C5-C6 and C6-C7 with some endplate spurring. No significant cervical spinal stenosis. Upper chest: Visible upper thoracic levels appear intact. Negative lung apices. IMPRESSION: 1. Large acute hemorrhage in the posterior left hemisphere centered in the left occipital lobe with estimated intra-axial blood volume 85 mL. Top differential considerations include hypertensive hemorrhage, AVM, or coagulopathy. 2. Extension into the posterior left subdural space and the ventricular system. Trace subarachnoid extension. Small volume left subdural hematoma at this time, and moderate to large volume of IVH. 3. Left hemisphere cerebral edema and intracranial mass effect with 9 mm of rightward midline shift. Mild to moderate ventriculomegaly. 4.  No acute fracture or listhesis identified in the cervical spine. 5. Critical Value/emergent results were called by telephone at the time of interpretation on 10/29/2016 at 4:16 pm to Dr. Willy EddyPATRICK ROBINSON , who verbally acknowledged these results. Electronically Signed   By: Odessa FlemingH  Hall M.D.   On: 10/24/2016 16:19   Ct Cervical Spine Wo Contrast  Result Date: 11/03/2016 CLINICAL DATA:  76 year old female with acute onset altered mental status and loss of language. EXAM: CT HEAD WITHOUT CONTRAST CT CERVICAL SPINE WITHOUT CONTRAST TECHNIQUE: Multidetector CT imaging of the head and cervical spine was performed following the standard protocol without intravenous contrast. Multiplanar CT image reconstructions of the cervical spine were  also generated. COMPARISON:  None. FINDINGS: CT HEAD FINDINGS Brain: Large hyperdense hemorrhage in the  posterior left hemisphere. Epicenter appears to be the left occipital lobe, but hemorrhage tracks into the posterior left temporal lobe. The intra-axial volume of hemorrhage is estimated at 85 mL- 73 x 53 x 44 mm (AP by transverse by CC). Surrounding cerebral edema. There is extension of hemorrhage into the left subdural space (mostly along the posterior falx and the tentorium) and into the ventricular system, with a moderate to large volume of blood in both lateral ventricles, greater on the left, and a small volume of third ventricle blood. There is also trace subarachnoid space blood also over the right superior convexity and posterior sylvian fissure. Intracranial mass effect with 9 mm of rightward midline shift. Effaced sulci in the left hemisphere. Partially effaced left ambient cistern. Other basilar cisterns are patent. Mild to moderate ventriculomegaly. Superimposed confluent bilateral cerebral white matter hypodensity. Vascular: Calcified atherosclerosis at the skull base. Skull: Osteopenia.  No skull fracture. Sinuses/Orbits: Clear. Other: No acute orbit or scalp soft tissue finding. CT CERVICAL SPINE FINDINGS Alignment: Straightening and mild reversal of cervical lordosis. Mild anterolisthesis of C4 on C5 and C7 on T1. Mild retrolisthesis of C5 on C6. Bilateral posterior element alignment is within normal limits. Skull base and vertebrae: Visualized skull base is intact. No atlanto-occipital dissociation. No cervical spine fracture identified. Soft tissues and spinal canal: No prevertebral fluid or swelling. No visible canal hematoma. Surgical clips along the left sternocleidomastoid muscle. Calcified carotid atherosclerosis. Partially visible proximal left subclavian artery stent. Disc levels: Widespread left side lumbar facet degeneration. Moderate to severe disc space loss at C5-C6 and C6-C7 with  some endplate spurring. No significant cervical spinal stenosis. Upper chest: Visible upper thoracic levels appear intact. Negative lung apices. IMPRESSION: 1. Large acute hemorrhage in the posterior left hemisphere centered in the left occipital lobe with estimated intra-axial blood volume 85 mL. Top differential considerations include hypertensive hemorrhage, AVM, or coagulopathy. 2. Extension into the posterior left subdural space and the ventricular system. Trace subarachnoid extension. Small volume left subdural hematoma at this time, and moderate to large volume of IVH. 3. Left hemisphere cerebral edema and intracranial mass effect with 9 mm of rightward midline shift. Mild to moderate ventriculomegaly. 4.  No acute fracture or listhesis identified in the cervical spine. 5. Critical Value/emergent results were called by telephone at the time of interpretation on 10/16/2016 at 4:16 pm to Dr. Willy Eddy , who verbally acknowledged these results. Electronically Signed   By: Odessa Fleming M.D.   On: 10/22/2016 16:19   Dg Chest Portable 1 View  Result Date: 10/27/2016 CLINICAL DATA:  Altered mental status this morning. EXAM: PORTABLE CHEST 1 VIEW COMPARISON:  None. FINDINGS: The heart size and mediastinal contours are within normal limits. Biapical scarring are noted. There is no focal infiltrate, pulmonary edema or pleural effusion. The visualized skeletal structures are unremarkable. IMPRESSION: No active cardiopulmonary disease. Electronically Signed   By: Sherian Rein M.D.   On: 10/29/2016 15:45   ASSESSMENT AND PLAN:  76 year old female patient with history of coronary artery disease on Plavix was reportedly normal state 1 day before admission found to have multiple episodes of vomiting, worsening level of consciousness found to have large intracranial hemorrhage in the left occipital lobe on CT head in ED with extension to his left subdural space and cerebral edema with mass effect and right word midline  shift.   *Acute intracranial hemorrhage - Patient condition discussed with granddaughter with the power of attorney, she wants comfort measures only. Says she is  on morphine, Ativan.  * hypertensive emergency  *Anemia of chronic disease *Coronary artery disease *Dementia *Hypothyroidism *Diabetes   She may require hospice home placement if remains alive next 24-48 hours  All the records are reviewed and case discussed with Care Management/Social Worker. Management plans discussed with the patient, family and they are in agreement.  CODE STATUS: DNR/comfort care  TOTAL TIME TAKING CARE OF THIS PATIENT: 25 minutes.   More than 50% of the time was spent in counseling/coordination of care: YES  POSSIBLE D/C IN 1-2 DAYS, DEPENDING ON CLINICAL CONDITION.   Delfino Lovett M.D on 10/16/2016 at 11:54 AM  Between 7am to 6pm - Pager - [phone]  After 6pm go to [email or web address] - Social research officer, government  [facility] [place] [facility]  Office  [phone]  CC: Primary care physician; Karie Schwalbe, MD  Note: This dictation was prepared with Dragon dictation along with smaller phrase technology. Any transcriptional errors that result from this process are unintentional.

## 2016-10-17 MED ORDER — MORPHINE SULFATE (CONCENTRATE) 10 MG/0.5ML PO SOLN
10.0000 mg | ORAL | Status: DC | PRN
  Administered 2016-10-17 – 2016-10-18 (×3): 10 mg via ORAL
  Filled 2016-10-17 (×3): qty 1

## 2016-10-17 NOTE — Progress Notes (Signed)
1        [employer] - [place] at [facility]   PATIENT NAME: Linda Austin    MR#:  [id number]  DATE OF BIRTH:  10/15/1940  SUBJECTIVE:  CHIEF COMPLAINT:   Chief Complaint  Patient presents with  . Altered Mental Status  Actively dying, family at bedside REVIEW OF SYSTEMS:  Review of Systems  Unable to perform ROS: Critical illness    DRUG ALLERGIES:   Allergies  Allergen Reactions  . Codeine    VITALS:  Blood pressure (!) 197/98, pulse (!) 109, temperature 98.3 F (36.8 C), temperature source Axillary, resp. rate (!) 28, height 5\' 2"  (1.575 m), weight 45.4 kg (100 lb), SpO2 92 %. PHYSICAL EXAMINATION:  Physical Exam  Constitutional: She appears lethargic, malnourished and dehydrated. She appears unhealthy. She appears cachectic. She appears toxic. She has a sickly appearance.  HENT:  Head: Normocephalic and atraumatic.  Eyes: Pupils are equal, round, and reactive to light. Conjunctivae and EOM are normal.  Neck: Normal range of motion. Neck supple. No tracheal deviation present. No thyromegaly present.  Cardiovascular: Normal rate, regular rhythm and normal heart sounds.   Pulmonary/Chest: Effort normal and breath sounds normal. No respiratory distress. She has no wheezes. She exhibits no tenderness.  Abdominal: Soft. Bowel sounds are normal. She exhibits no distension. There is no tenderness.  Musculoskeletal: Normal range of motion.  Neurological: She appears lethargic. She is disoriented. No cranial nerve deficit.  Comatose  Skin: Skin is warm and dry. No rash noted.  Psychiatric:  Unable to assess due to comatose status   LABORATORY PANEL:  Female CBC  Recent Labs Lab February 09, 2017 1509  WBC 19.3*  HGB 11.7*  HCT 34.2*  PLT 207   ------------------------------------------------------------------------------------------------------------------ Chemistries   Recent Labs Lab February 09, 2017 1509  NA 137  K 3.9  CL 99*  CO2 24  GLUCOSE 268*  BUN  26*  CREATININE 0.78  CALCIUM 9.6  AST 61*  ALT 41  ALKPHOS 78  BILITOT 0.7   RADIOLOGY:  No results found. ASSESSMENT AND PLAN:  76 year old female patient with history of coronary artery disease on Plavix was reportedly normal state 1 day before admission found to have multiple episodes of vomiting, worsening level of consciousness found to have large intracranial hemorrhage in the left occipital lobe on CT head in ED with extension to his left subdural space and cerebral edema with mass effect and right word midline shift.   *Acute intracranial hemorrhage -Comfort care only, if still alive can go to hospice home tomorrow if bed available.  * hypertensive emergency  *Anemia of chronic disease *Coronary artery disease *Dementia *Hypothyroidism *Diabetes   She may require hospice home placement if remains alive next 24-48 hours  All the records are reviewed and case discussed with Care Management/Social Worker. Management plans discussed with the patient, family and they are in agreement.  CODE STATUS: DNR/comfort care  TOTAL TIME TAKING CARE OF THIS PATIENT: 25 minutes.   More than 50% of the time was spent in counseling/coordination of care: YES  POSSIBLE D/C IN 1 DAYS, DEPENDING ON CLINICAL CONDITION.   Delfino LovettVipul Rashard Ryle M.D on 10/17/2016 at 11:47 AM  Between 7am to 6pm - Pager - [phone]  After 6pm go to [email or web address] - Social research officer, governmentpassword EPAS [facility]  [facility] [place] [facility]  Office  [phone]  CC: Primary care physician; Karie SchwalbeLetvak, Richard I, MD  Note: This dictation was prepared with Dragon dictation along with smaller phrase technology. Any transcriptional errors that result  from this process are unintentional.

## 2016-10-17 NOTE — Progress Notes (Signed)
Pt is comfort care.  Currently unresponsive with labored breath and periods of apnea.  Pt is warm and dry.  No urine output.  Family requests pt not be disturbed with turning or evening oral care at this time.  Discussed with family about medication for comfort and scheduled.  Family states pt is not in pain at this time and decline any prn medication.  Provided water for family members and showed them how to call me directly.  Assured family that I was available any time that they needed me.  Family satisfied and comfortable with plan of care at this time. Henriette CombsSarah Suvi Archuletta RN

## 2016-10-17 NOTE — Care Management Note (Signed)
Case Management Note  Patient Details  Name: Linda Austin MRN: [id number] Date of Birth: 03/14/1940  Subjective/Objective:   Admitted to [facility] with the diagnosis of intracranial hemorrhage accident. A resident of [facility]. Granddaughter is Human resources officerAngel Hocutt [phone]).  [name] is currently unresponsive.  Received referral for Hospice Home Placement. Spoke with Dr. Sherryll BurgerShah. Discussed if Ms Richardson DoppCole was [facility] Dr. Sherryll BurgerShah indicated that he thought Ms Richardson DoppCole [facility]                 Action/Plan: Text message sent to Minerva AreolaEric, Clinical Social Worker   Expected Discharge Date:                  Expected Discharge Plan:     In-House Referral:     Discharge planning Services     Post Acute Care Choice:    Choice offered to:     DME Arranged:    DME Agency:     HH Arranged:    HH Agency:     Status of Service:     If discussed at [employer] Meetings, dates discussed:    Additional Comments:  Gwenette GreetBrenda S Lavayah Vita, RN MSN CCM Care Management [phone] 10/17/2016, 9:55 AM

## 2016-10-17 NOTE — Clinical Social Work Note (Addendum)
CSW met with patient's daughter Orene Desanctis who is Chauncey Reading has agreed to have patient go to [facility] if bed is available.  CSW contacted Langley Gauss [phone] at [facility], they will review patient's information and contact CSW back.  Hospice facility said they have a few beds available.  11:40am CSW received phone call from [facility], they can not accept patient today, but they can accept her on Tuesday.  CSW updated physician and family, CSW to continue to follow patient's progress throughout discharge planning.  Jones Broom. [place], MSW, [place]  10/17/2016 11:23 AM

## 2016-11-14 NOTE — Death Summary Note (Signed)
DEATH SUMMARY   Patient Details  Name: Linda Austin MRN: [id number] DOB: 1940/09/16  Admission/Discharge Information   Admit Date:  10-20-16  Date of Death: Date of Death: 2016/10/23  Time of Death: Time of Death: 07-14-2022  Length of Stay: 3  Referring Physician: Karie Schwalbe, MD   Reason(s) for Hospitalization  Stroke  Diagnoses  Preliminary cause of death:  Intracranial hemorrhage Secondary Diagnoses (including complications and co-morbidities):  Active Problems:   Intracranial hemorrhage [facility] Hypertensive emergency Dementia  Brief Hospital Course (including significant findings, care, treatment, and services provided and events leading to death)  Linda Austin is a 76 y.o. year old female with history of coronary artery disease on Plavix was reportedly normal state 1 day before admission found to have multiple episodes of vomiting, worsening level of consciousness found to have large intracranial hemorrhage in the left occipital lobe on CT head in ED with extension to his left subdural space and cerebral edema with mass effect and right word midline shift.   *Acute intracranial hemorrhage -Comfort care only per family  * hypertensive emergency  *Anemia of chronic disease *Coronary artery disease *Dementia *Hypothyroidism *Diabetes  Pertinent Labs and Studies  Significant Diagnostic Studies Ct Abdomen Pelvis Wo Contrast  Result Date: 20-Oct-2016 CLINICAL DATA:  76 year old female with abdominal and pelvic pain. EXAM: CT ABDOMEN AND PELVIS WITHOUT CONTRAST TECHNIQUE: Multidetector CT imaging of the abdomen and pelvis was performed following the standard protocol without IV contrast. COMPARISON:  None. FINDINGS: Please note that parenchymal abnormalities may be missed without intravenous contrast. Lower chest: No acute abnormality Hepatobiliary: The liver is unremarkable. The patient is status post cholecystectomy. No biliary dilatation. Pancreas: Unremarkable Spleen:  Unremarkable Adrenals/Urinary Tract: No acute abnormalities of the kidneys. The adrenal glands are unremarkable. Bladder distention noted. Stomach/Bowel: There is no evidence of bowel obstruction, definite bowel wall thickening or inflammatory changes. Colonic diverticulosis noted without evidence of diverticulitis. Vascular/Lymphatic: Heavy atherosclerotic aortic and other vascular calcifications are noted. Ectatic distal abdominal aorta noted measuring 2.8 cm in greatest diameter. 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 No enlarged lymph nodes are identified. Reproductive: Status post hysterectomy. No adnexal masses. Other: No ascites, focal collection or pneumoperitoneum. Musculoskeletal: No acute abnormality or suspicious bony lesion. IMPRESSION: 1. No evidence of acute abnormality. 2. 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 Consider six-month cross-sectional imaging follow-up as clinically indicated. 3. Ectatic abdominal aorta. Ectatic abdominal aorta at risk for aneurysm development. Recommend followup by ultrasound in 5 years. This recommendation follows ACR consensus guidelines: White Paper of the ACR Incidental Findings Committee II on Vascular Findings. J Am Coll Radiol 2013; 40:981-191. 4.  Aortic Atherosclerosis (ICD10-I70.0). Electronically Signed   By: Harmon Pier M.D.   On: 2016-10-20 16:15   Ct Head Wo Contrast  Result Date: 2016-10-20 CLINICAL DATA:  76 year old female with acute onset altered mental status and loss of language. EXAM: CT HEAD WITHOUT CONTRAST CT CERVICAL SPINE WITHOUT CONTRAST TECHNIQUE: Multidetector CT imaging of the head and cervical spine was performed following the standard protocol without intravenous contrast. Multiplanar CT image reconstructions of the cervical spine were also generated. COMPARISON:  None. FINDINGS: CT HEAD FINDINGS Brain: Large hyperdense hemorrhage in the posterior left hemisphere. Epicenter appears to be the  left occipital lobe, but hemorrhage tracks into the posterior left temporal lobe. The intra-axial volume of hemorrhage is estimated at 85 mL- 73 x 53 x 44 mm (AP by transverse by CC). Surrounding cerebral edema. There  is extension of hemorrhage into the left subdural space (mostly along the posterior falx and the tentorium) and into the ventricular system, with a moderate to large volume of blood in both lateral ventricles, greater on the left, and a small volume of third ventricle blood. There is also trace subarachnoid space blood also over the right superior convexity and posterior sylvian fissure. Intracranial mass effect with 9 mm of rightward midline shift. Effaced sulci in the left hemisphere. Partially effaced left ambient cistern. Other basilar cisterns are patent. Mild to moderate ventriculomegaly. Superimposed confluent bilateral cerebral white matter hypodensity. Vascular: Calcified atherosclerosis at the skull base. Skull: Osteopenia.  No skull fracture. Sinuses/Orbits: Clear. Other: No acute orbit or scalp soft tissue finding. CT CERVICAL SPINE FINDINGS Alignment: Straightening and mild reversal of cervical lordosis. Mild anterolisthesis of C4 on C5 and C7 on T1. Mild retrolisthesis of C5 on C6. Bilateral posterior element alignment is within normal limits. Skull base and vertebrae: Visualized skull base is intact. No atlanto-occipital dissociation. No cervical spine fracture identified. Soft tissues and spinal canal: No prevertebral fluid or swelling. No visible canal hematoma. Surgical clips along the left sternocleidomastoid muscle. Calcified carotid atherosclerosis. Partially visible proximal left subclavian artery stent. Disc levels: Widespread left side lumbar facet degeneration. Moderate to severe disc space loss at C5-C6 and C6-C7 with some endplate spurring. No significant cervical spinal stenosis. Upper chest: Visible upper thoracic levels appear intact. Negative lung apices. IMPRESSION: 1.  Large acute hemorrhage in the posterior left hemisphere centered in the left occipital lobe with estimated intra-axial blood volume 85 mL. Top differential considerations include hypertensive hemorrhage, AVM, or coagulopathy. 2. Extension into the posterior left subdural space and the ventricular system. Trace subarachnoid extension. Small volume left subdural hematoma at this time, and moderate to large volume of IVH. 3. Left hemisphere cerebral edema and intracranial mass effect with 9 mm of rightward midline shift. Mild to moderate ventriculomegaly. 4.  No acute fracture or listhesis identified in the cervical spine. 5. Critical Value/emergent results were called by telephone at the time of interpretation on 11/10/2016 at 4:16 pm to Dr. Willy Eddy , who verbally acknowledged these results. Electronically Signed   By: Odessa Fleming M.D.   On: 10/16/2016 16:19   Ct Cervical Spine Wo Contrast  Result Date: 10/17/2016 CLINICAL DATA:  76 year old female with acute onset altered mental status and loss of language. EXAM: CT HEAD WITHOUT CONTRAST CT CERVICAL SPINE WITHOUT CONTRAST TECHNIQUE: Multidetector CT imaging of the head and cervical spine was performed following the standard protocol without intravenous contrast. Multiplanar CT image reconstructions of the cervical spine were also generated. COMPARISON:  None. FINDINGS: CT HEAD FINDINGS Brain: Large hyperdense hemorrhage in the posterior left hemisphere. Epicenter appears to be the left occipital lobe, but hemorrhage tracks into the posterior left temporal lobe. The intra-axial volume of hemorrhage is estimated at 85 mL- 73 x 53 x 44 mm (AP by transverse by CC). Surrounding cerebral edema. There is extension of hemorrhage into the left subdural space (mostly along the posterior falx and the tentorium) and into the ventricular system, with a moderate to large volume of blood in both lateral ventricles, greater on the left, and a small volume of third ventricle  blood. There is also trace subarachnoid space blood also over the right superior convexity and posterior sylvian fissure. Intracranial mass effect with 9 mm of rightward midline shift. Effaced sulci in the left hemisphere. Partially effaced left ambient cistern. Other basilar cisterns are patent. Mild to moderate  ventriculomegaly. Superimposed confluent bilateral cerebral white matter hypodensity. Vascular: Calcified atherosclerosis at the skull base. Skull: Osteopenia.  No skull fracture. Sinuses/Orbits: Clear. Other: No acute orbit or scalp soft tissue finding. CT CERVICAL SPINE FINDINGS Alignment: Straightening and mild reversal of cervical lordosis. Mild anterolisthesis of C4 on C5 and C7 on T1. Mild retrolisthesis of C5 on C6. Bilateral posterior element alignment is within normal limits. Skull base and vertebrae: Visualized skull base is intact. No atlanto-occipital dissociation. No cervical spine fracture identified. Soft tissues and spinal canal: No prevertebral fluid or swelling. No visible canal hematoma. Surgical clips along the left sternocleidomastoid muscle. Calcified carotid atherosclerosis. Partially visible proximal left subclavian artery stent. Disc levels: Widespread left side lumbar facet degeneration. Moderate to severe disc space loss at C5-C6 and C6-C7 with some endplate spurring. No significant cervical spinal stenosis. Upper chest: Visible upper thoracic levels appear intact. Negative lung apices. IMPRESSION: 1. Large acute hemorrhage in the posterior left hemisphere centered in the left occipital lobe with estimated intra-axial blood volume 85 mL. Top differential considerations include hypertensive hemorrhage, AVM, or coagulopathy. 2. Extension into the posterior left subdural space and the ventricular system. Trace subarachnoid extension. Small volume left subdural hematoma at this time, and moderate to large volume of IVH. 3. Left hemisphere cerebral edema and intracranial mass effect  with 9 mm of rightward midline shift. Mild to moderate ventriculomegaly. 4.  No acute fracture or listhesis identified in the cervical spine. 5. Critical Value/emergent results were called by telephone at the time of interpretation on 10/31/2016 at 4:16 pm to Dr. Willy EddyPATRICK ROBINSON , who verbally acknowledged these results. Electronically Signed   By: Odessa FlemingH  Hall M.D.   On: 11/11/2016 16:19   Dg Chest Portable 1 View  Result Date: 11/13/2016 CLINICAL DATA:  Altered mental status this morning. EXAM: PORTABLE CHEST 1 VIEW COMPARISON:  None. FINDINGS: The heart size and mediastinal contours are within normal limits. Biapical scarring are noted. There is no focal infiltrate, pulmonary edema or pleural effusion. The visualized skeletal structures are unremarkable. IMPRESSION: No active cardiopulmonary disease. Electronically Signed   By: Sherian ReinWei-Chen  Lin M.D.   On: 11/05/2016 15:45    Microbiology No results found for this or any previous visit (from the past 240 hour(s)).  Lab Basic Metabolic Panel:  Recent Labs Lab 10/17/2016 1509  NA 137  K 3.9  CL 99*  CO2 24  GLUCOSE 268*  BUN 26*  CREATININE 0.78  CALCIUM 9.6   Liver Function Tests:  Recent Labs Lab 11/05/2016 1509  AST 61*  ALT 41  ALKPHOS 78  BILITOT 0.7  PROT 8.2*  ALBUMIN 4.2   No results for input(s): LIPASE, AMYLASE in the last 168 hours. No results for input(s): AMMONIA in the last 168 hours. CBC:  Recent Labs Lab 10/25/2016 1509  WBC 19.3*  HGB 11.7*  HCT 34.2*  MCV 88.9  PLT 207   Cardiac Enzymes:  Recent Labs Lab 11/11/2016 1509  TROPONINI <0.03   Sepsis Labs:  Recent Labs Lab 11/08/2016 1509 11/02/2016 1520  WBC 19.3*  --   LATICACIDVEN  --  2.5*    Procedures/Operations  none  Linda Austin 10/28/2016, 4:04 PM

## 2016-11-14 NOTE — Progress Notes (Signed)
Called to room at 0005 and pt was gasping, using accessory muscles with periods of apnea.  Family requested Morphine and zofrane because of nausea caused by Morphine.  Stayed in the room with family and pt [facility] at [facility] [id number]  Phyllis second Training and development officerverifier .  Called chaplin and nursing supervisor. Chaplin in room to talk with family.  [place] donor cleared pt.  Per NS pt to be moved to morgue and funeral home to be called in AM. Will prepare body once family has left. Henriette CombsSarah Jahid Weida RN

## 2016-11-14 DEATH — deceased

## 2017-04-26 ENCOUNTER — Ambulatory Visit: Payer: Self-pay | Admitting: Internal Medicine

## 2017-12-28 IMAGING — DX DG CHEST 1V PORT
1 series · 1 of 1 positions shown · non-contrast
Comparison: None.

CLINICAL DATA: Altered mental status this morning.

EXAM:
PORTABLE CHEST 1 VIEW

[chest ap]
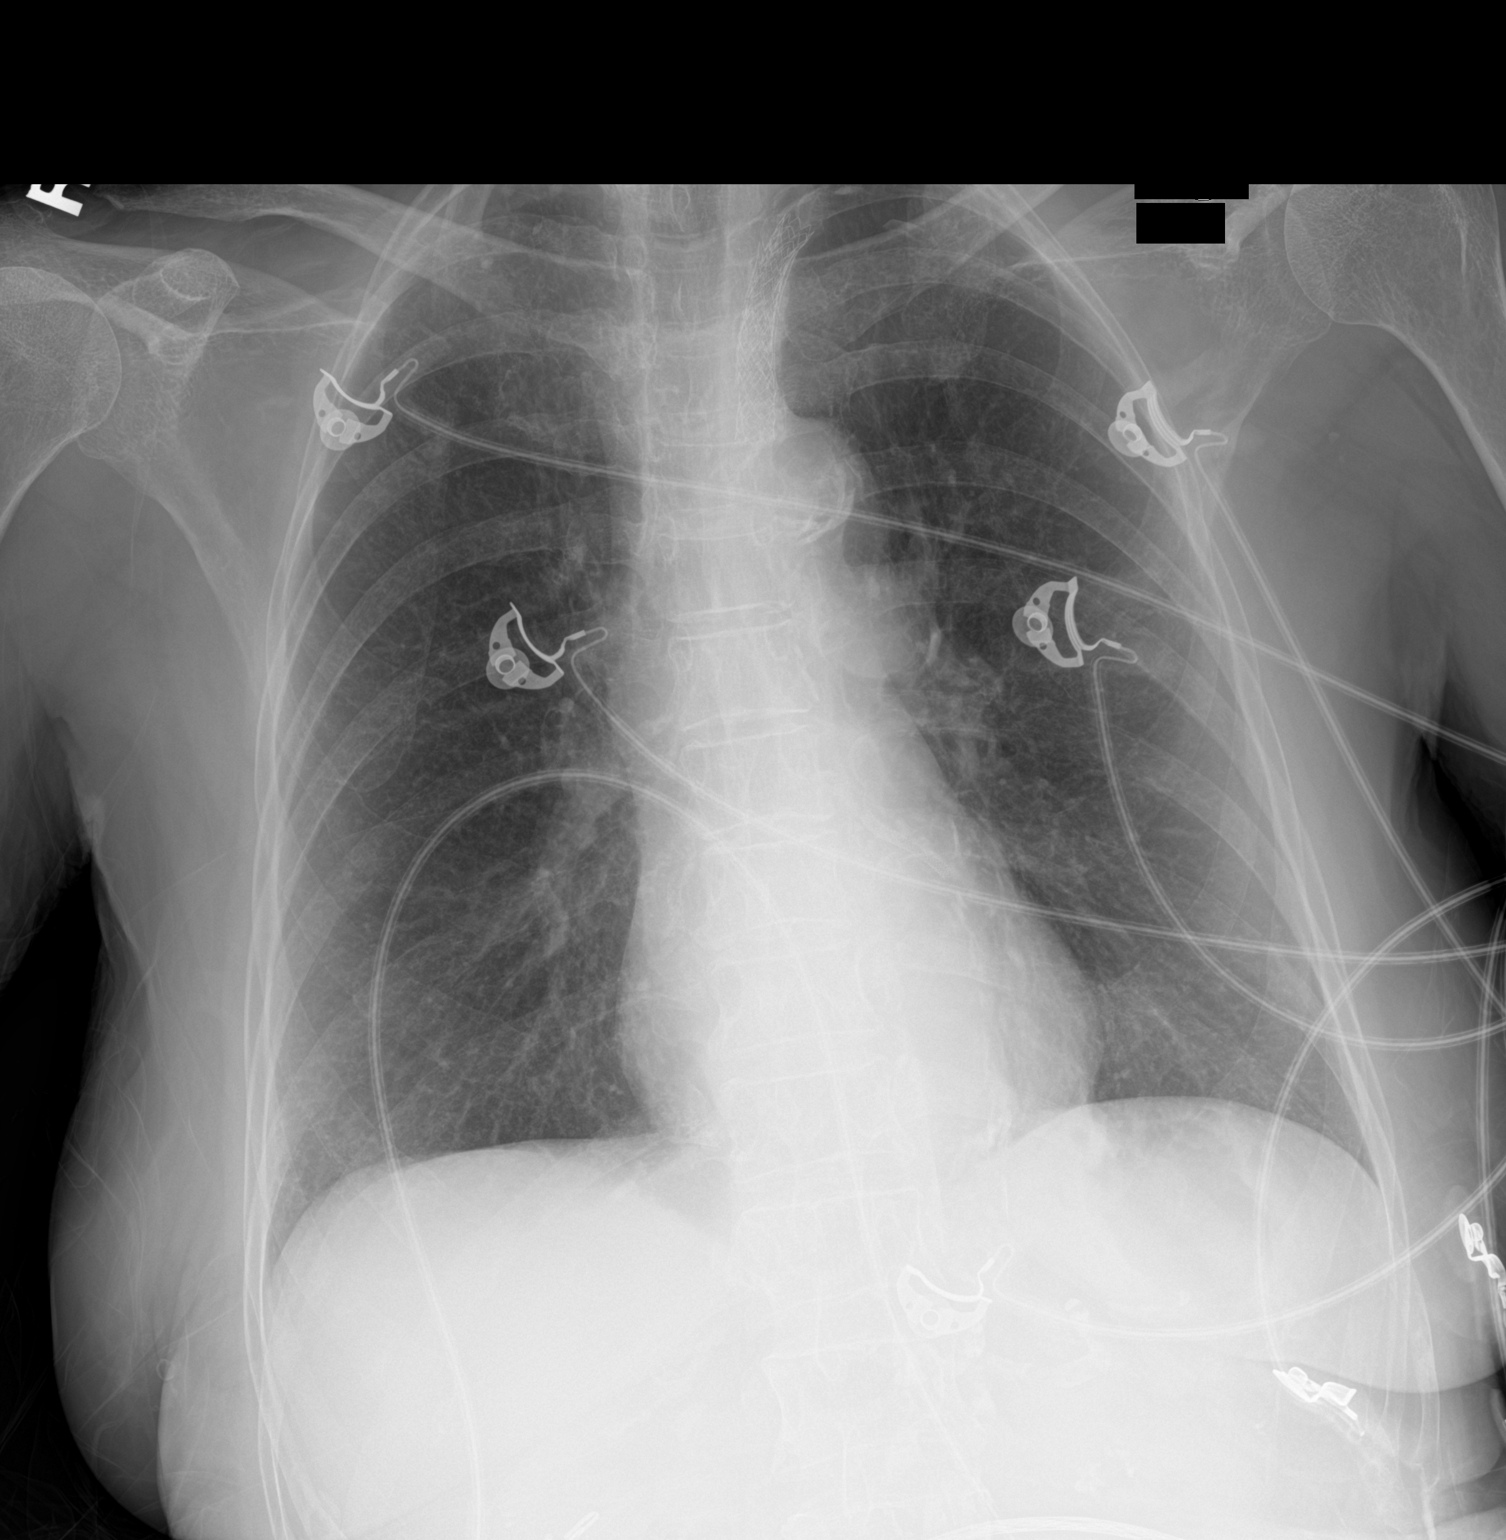

[1 of 1 positions shown; findings below may reference images not displayed]

FINDINGS: The heart size and mediastinal contours are within normal limits.
Biapical scarring are noted. There is no focal infiltrate, pulmonary
edema or pleural effusion. The visualized skeletal structures are
unremarkable.
IMPRESSION: No active cardiopulmonary disease.
